# Patient Record
Sex: Female | Born: 1955 | Race: White | Hispanic: No | Marital: Married
Health system: Southern US, Community
[De-identification: ages and names within clinical notes are randomized; demographics above are authoritative.]

## PROBLEM LIST (undated history)

## (undated) HISTORY — PX: ABDOMINAL HYSTERECTOMY: SHX81

## (undated) HISTORY — PX: BREAST BIOPSY: SHX20

---

## 2004-01-14 ENCOUNTER — Ambulatory Visit: Payer: Self-pay | Admitting: Unknown Physician Specialty

## 2004-02-26 ENCOUNTER — Ambulatory Visit: Payer: Self-pay | Admitting: Unknown Physician Specialty

## 2012-06-21 ENCOUNTER — Encounter: Payer: Self-pay | Admitting: General Surgery

## 2012-07-04 ENCOUNTER — Encounter: Payer: Self-pay | Admitting: General Surgery

## 2012-07-06 ENCOUNTER — Encounter: Payer: Self-pay | Admitting: *Deleted

## 2015-06-04 ENCOUNTER — Other Ambulatory Visit: Payer: Self-pay | Admitting: Family Medicine

## 2015-06-04 DIAGNOSIS — Z1231 Encounter for screening mammogram for malignant neoplasm of breast: Secondary | ICD-10-CM

## 2015-06-05 ENCOUNTER — Ambulatory Visit
Admission: RE | Admit: 2015-06-05 | Discharge: 2015-06-05 | Disposition: A | Discharge status: Home / Self Care | Payer: BLUE CROSS/BLUE SHIELD | Source: Ambulatory Visit | Attending: Family Medicine | Admitting: Family Medicine

## 2015-06-05 DIAGNOSIS — Z1231 Encounter for screening mammogram for malignant neoplasm of breast: Secondary | ICD-10-CM | POA: Insufficient documentation

## 2015-10-15 ENCOUNTER — Ambulatory Visit
Admission: RE | Admit: 2015-10-15 | Payer: BLUE CROSS/BLUE SHIELD | Source: Ambulatory Visit | Admitting: Gastroenterology

## 2015-10-15 ENCOUNTER — Encounter: Admission: RE | Payer: Self-pay | Source: Ambulatory Visit

## 2015-10-15 SURGERY — COLONOSCOPY WITH PROPOFOL
Anesthesia: General

## 2017-10-26 ENCOUNTER — Other Ambulatory Visit: Payer: Self-pay | Admitting: Family Medicine

## 2017-10-26 DIAGNOSIS — Z1231 Encounter for screening mammogram for malignant neoplasm of breast: Secondary | ICD-10-CM

## 2017-10-27 ENCOUNTER — Ambulatory Visit
Admission: RE | Admit: 2017-10-27 | Discharge: 2017-10-27 | Disposition: A | Discharge status: Home / Self Care | Payer: Managed Care, Other (non HMO) | Source: Ambulatory Visit | Attending: Family Medicine | Admitting: Family Medicine

## 2017-10-27 DIAGNOSIS — Z1231 Encounter for screening mammogram for malignant neoplasm of breast: Secondary | ICD-10-CM | POA: Diagnosis present

## 2018-09-05 ENCOUNTER — Other Ambulatory Visit: Payer: Self-pay | Admitting: Family Medicine

## 2018-09-05 DIAGNOSIS — Z1231 Encounter for screening mammogram for malignant neoplasm of breast: Secondary | ICD-10-CM

## 2018-10-31 ENCOUNTER — Ambulatory Visit
Admission: RE | Admit: 2018-10-31 | Discharge: 2018-10-31 | Disposition: A | Discharge status: Home / Self Care | Payer: 59 | Source: Ambulatory Visit | Attending: Family Medicine | Admitting: Family Medicine

## 2018-10-31 DIAGNOSIS — Z1231 Encounter for screening mammogram for malignant neoplasm of breast: Secondary | ICD-10-CM

## 2019-09-11 ENCOUNTER — Other Ambulatory Visit: Payer: Self-pay | Admitting: Family Medicine

## 2019-09-11 DIAGNOSIS — Z1231 Encounter for screening mammogram for malignant neoplasm of breast: Secondary | ICD-10-CM

## 2019-11-02 ENCOUNTER — Ambulatory Visit
Admission: RE | Admit: 2019-11-02 | Discharge: 2019-11-02 | Disposition: A | Discharge status: Home / Self Care | Payer: 59 | Source: Ambulatory Visit | Attending: Family Medicine | Admitting: Family Medicine

## 2019-11-02 ENCOUNTER — Other Ambulatory Visit: Payer: Self-pay

## 2019-11-02 DIAGNOSIS — Z1231 Encounter for screening mammogram for malignant neoplasm of breast: Secondary | ICD-10-CM | POA: Diagnosis not present

## 2020-06-10 ENCOUNTER — Other Ambulatory Visit: Payer: Self-pay

## 2020-06-10 ENCOUNTER — Ambulatory Visit (INDEPENDENT_AMBULATORY_CARE_PROVIDER_SITE_OTHER): Payer: Managed Care, Other (non HMO) | Admitting: Dermatology

## 2020-06-10 DIAGNOSIS — I8393 Asymptomatic varicose veins of bilateral lower extremities: Secondary | ICD-10-CM | POA: Diagnosis not present

## 2020-06-10 DIAGNOSIS — Z872 Personal history of diseases of the skin and subcutaneous tissue: Secondary | ICD-10-CM | POA: Diagnosis not present

## 2020-06-10 DIAGNOSIS — L821 Other seborrheic keratosis: Secondary | ICD-10-CM

## 2020-06-10 DIAGNOSIS — D229 Melanocytic nevi, unspecified: Secondary | ICD-10-CM

## 2020-06-10 DIAGNOSIS — Z1283 Encounter for screening for malignant neoplasm of skin: Secondary | ICD-10-CM

## 2020-06-10 DIAGNOSIS — L814 Other melanin hyperpigmentation: Secondary | ICD-10-CM

## 2020-06-10 DIAGNOSIS — Z808 Family history of malignant neoplasm of other organs or systems: Secondary | ICD-10-CM | POA: Diagnosis not present

## 2020-06-10 DIAGNOSIS — L578 Other skin changes due to chronic exposure to nonionizing radiation: Secondary | ICD-10-CM

## 2020-06-10 DIAGNOSIS — D1800 Hemangioma unspecified site: Secondary | ICD-10-CM

## 2020-06-10 NOTE — Patient Instructions (Signed)

## 2020-06-10 NOTE — Progress Notes (Signed)
   Follow-Up Visit   Subjective  Brandy Hudson is a 65 y.o. female who presents for the following: Annual Exam (Family history of Melanoma - sister  TBSE today). The patient presents for Total-Body Skin Exam (TBSE) for skin cancer screening and mole check.  The following portions of the chart were reviewed this encounter and updated as appropriate:   Allergies  Meds  Problems  Med Hx  Surg Hx  Fam Hx     Review of Systems:  No other skin or systemic complaints except as noted in HPI or Assessment and Plan.  Objective  Well appearing patient in no apparent distress; mood and affect are within normal limits.  A full examination was performed including scalp, head, eyes, ears, nose, lips, neck, chest, axillae, abdomen, back, buttocks, bilateral upper extremities, bilateral lower extremities, hands, feet, fingers, toes, fingernails, and toenails. All findings within normal limits unless otherwise noted below.  Objective  Bilateral lower legs: Spider veins  Objective  Head - Anterior (Face): Clear today   Assessment & Plan    Lentigines - Scattered tan macules - Due to sun exposure - Benign-appering, observe - Recommend daily broad spectrum sunscreen SPF 30+ to sun-exposed areas, reapply every 2 hours as needed. - Call for any changes  Seborrheic Keratoses - Stuck-on, waxy, tan-brown papules and/or plaques  - Benign-appearing - Discussed benign etiology and prognosis. - Observe - Call for any changes  Melanocytic Nevi - Tan-brown and/or pink-flesh-colored symmetric macules and papules - Benign appearing on exam today - Observation - Call clinic for new or changing moles - Recommend daily use of broad spectrum spf 30+ sunscreen to sun-exposed areas.   Hemangiomas - Red papules - Discussed benign nature - Observe - Call for any changes  Actinic Damage - Chronic condition, secondary to cumulative UV/sun exposure - diffuse scaly erythematous macules with  underlying dyspigmentation - Recommend daily broad spectrum sunscreen SPF 30+ to sun-exposed areas, reapply every 2 hours as needed.  - Staying in the shade or wearing long sleeves, sun glasses (UVA+UVB protection) and wide brim hats (4-inch brim around the entire circumference of the hat) are also recommended for sun protection.  - Call for new or changing lesions.  Skin cancer screening performed today.  Family history of Melanoma - sister  Spider veins of both lower extremities Bilateral lower legs Benign, observe.   History of actinic keratoses Head - Anterior (Face) Clear. Observe for recurrence. Call clinic for new or changing lesions.  Recommend regular skin exams, daily broad-spectrum spf 30+ sunscreen use, and photoprotection.    Skin cancer screening  Return in about 1 year (around 06/10/2021).  I, Ashok Cordia, CMA, am acting as scribe for Sarina Ser, MD .  Documentation: I have reviewed the above documentation for accuracy and completeness, and I agree with the above.  Sarina Ser, MD

## 2020-06-11 ENCOUNTER — Encounter: Payer: Self-pay | Admitting: Dermatology

## 2020-09-24 ENCOUNTER — Other Ambulatory Visit: Payer: Self-pay | Admitting: Family Medicine

## 2020-09-24 DIAGNOSIS — Z1231 Encounter for screening mammogram for malignant neoplasm of breast: Secondary | ICD-10-CM

## 2020-11-04 ENCOUNTER — Ambulatory Visit
Admission: RE | Admit: 2020-11-04 | Discharge: 2020-11-04 | Disposition: A | Discharge status: Home / Self Care | Payer: Managed Care, Other (non HMO) | Source: Ambulatory Visit | Attending: Family Medicine | Admitting: Family Medicine

## 2020-11-04 ENCOUNTER — Other Ambulatory Visit: Payer: Self-pay

## 2020-11-04 DIAGNOSIS — Z1231 Encounter for screening mammogram for malignant neoplasm of breast: Secondary | ICD-10-CM | POA: Diagnosis not present

## 2021-06-16 ENCOUNTER — Ambulatory Visit (INDEPENDENT_AMBULATORY_CARE_PROVIDER_SITE_OTHER): Payer: Managed Care, Other (non HMO) | Admitting: Dermatology

## 2021-06-16 DIAGNOSIS — L820 Inflamed seborrheic keratosis: Secondary | ICD-10-CM

## 2021-06-16 DIAGNOSIS — Z1283 Encounter for screening for malignant neoplasm of skin: Secondary | ICD-10-CM | POA: Diagnosis not present

## 2021-06-16 DIAGNOSIS — D225 Melanocytic nevi of trunk: Secondary | ICD-10-CM | POA: Diagnosis not present

## 2021-06-16 DIAGNOSIS — L578 Other skin changes due to chronic exposure to nonionizing radiation: Secondary | ICD-10-CM | POA: Diagnosis not present

## 2021-06-16 DIAGNOSIS — D485 Neoplasm of uncertain behavior of skin: Secondary | ICD-10-CM

## 2021-06-16 DIAGNOSIS — L570 Actinic keratosis: Secondary | ICD-10-CM | POA: Diagnosis not present

## 2021-06-16 DIAGNOSIS — D229 Melanocytic nevi, unspecified: Secondary | ICD-10-CM | POA: Diagnosis not present

## 2021-06-16 DIAGNOSIS — L821 Other seborrheic keratosis: Secondary | ICD-10-CM

## 2021-06-16 DIAGNOSIS — L814 Other melanin hyperpigmentation: Secondary | ICD-10-CM

## 2021-06-16 DIAGNOSIS — D1800 Hemangioma unspecified site: Secondary | ICD-10-CM | POA: Diagnosis not present

## 2021-06-16 NOTE — Patient Instructions (Signed)
Wound Care Instructions ? ?Cleanse wound gently with soap and water once a day then pat dry with clean gauze. Apply a thing coat of Petrolatum (petroleum jelly, "Vaseline") over the wound (unless you have an allergy to this). We recommend that you use a new, sterile tube of Vaseline. Do not pick or remove scabs. Do not remove the yellow or white "healing tissue" from the base of the wound. ? ?Cover the wound with fresh, clean, nonstick gauze and secure with paper tape. You may use Band-Aids in place of gauze and tape if the would is small enough, but would recommend trimming much of the tape off as there is often too much. Sometimes Band-Aids can irritate the skin. ? ?You should call the office for your biopsy report after 1 week if you have not already been contacted. ? ?If you experience any problems, such as abnormal amounts of bleeding, swelling, significant bruising, significant pain, or evidence of infection, please call the office immediately. ? ?FOR ADULT SURGERY PATIENTS: If you need something for pain relief you may take 1 extra strength Tylenol (acetaminophen) AND 2 Ibuprofen ('200mg'$  each) together every 4 hours as needed for pain. (do not take these if you are allergic to them or if you have a reason you should not take them.) Typically, you may only need pain medication for 1 to 3 days.  ? ? ? ?Cryotherapy Aftercare ? ?Wash gently with soap and water everyday.   ?Apply Vaseline and Band-Aid daily until healed.  ? ? ?If You Need Anything After Your Visit ? ?If you have any questions or concerns for your doctor, please call our main line at [phone] and press option 4 to reach your doctor's medical assistant. If no one answers, please leave a voicemail as directed and we will return your call as soon as possible. Messages left after 4 pm will be answered the following business day.  ? ?You may also send [place] a message via MyChart. We typically respond to MyChart messages within 1-2 business days. ? ?For  prescription refills, please ask your pharmacy to contact our office. Our fax number is [phone]. ? ?If you have an urgent issue when the clinic is closed that cannot wait until the next business day, you can page your doctor at the number below.   ? ?Please note that while we do our best to be available for urgent issues outside of office hours, we are not available 24/7.  ? ?If you have an urgent issue and are unable to reach [place], you may choose to seek medical care at your doctor's office, retail clinic, urgent care center, or emergency room. ? ?If you have a medical emergency, please immediately call 911 or go to the emergency department. ? ?Pager Numbers ? ?- Dr. Nehemiah Massed: [phone] ? ?- Dr. Laurence Ferrari: [phone] ? ?- Dr. Nicole Kindred: [phone] ? ?In the event of inclement weather, please call our main line at [phone] for an update on the status of any delays or closures. ? ?Dermatology Medication Tips: ?Please keep the boxes that topical medications come in in order to help keep track of the instructions about where and how to use these. Pharmacies typically print the medication instructions only on the boxes and not directly on the medication tubes.  ? ?If your medication is too expensive, please contact our office at [phone] option 4 or send [place] a message through [place].  ? ?We are unable to tell what your co-pay for medications will be in advance as  this is different depending on your insurance coverage. However, we may be able to find a substitute medication at lower cost or fill out paperwork to get insurance to cover a needed medication.  ? ?If a prior authorization is required to get your medication covered by your insurance company, please allow [place] 1-2 business days to complete this process. ? ?Drug prices often vary depending on where the prescription is filled and some pharmacies may offer cheaper prices. ? ?The website www.goodrx.com contains coupons for medications through different  pharmacies. The prices here do not account for what the cost may be with help from insurance (it may be cheaper with your insurance), but the website can give you the price if you did not use any insurance.  ?- You can print the associated coupon and take it with your prescription to the pharmacy.  ?- You may also stop by our office during regular business hours and pick up a GoodRx coupon card.  ?- If you need your prescription sent electronically to a different pharmacy, notify our office through [facility] or by phone at [phone] option 4. ? ? ? ? ?Si Usted Necesita Algo Despu?s de Su Visita ? ?Tambi?n puede enviarnos un mensaje a trav?s de MyChart. Por lo general respondemos a los mensajes de MyChart en el transcurso de 1 a 2 d?as h?biles. ? ?Para renovar recetas, por favor pida a su farmacia que se ponga en contacto con nuestra oficina. Nuestro n?mero de fax es el [phone]. ? ?Si tiene un asunto urgente cuando la cl?nica est? cerrada y que no puede esperar hasta el siguiente d?a h?bil, puede llamar/localizar a su doctor(a) al n?mero que aparece a continuaci?n.  ? ?Por favor, tenga en cuenta que aunque hacemos todo lo posible para estar disponibles para asuntos urgentes fuera del horario de oficina, no estamos disponibles las 24 horas del d?a, los 7 d?as de la semana.  ? ?Si tiene un problema urgente y no puede comunicarse con nosotros, puede optar por buscar atenci?n m?dica  en el consultorio de su doctor(a), en una cl?nica privada, en un centro de atenci?n urgente o en una sala de emergencias. ? ?Si tiene Engineer, maintenance (IT) m?dica, por favor llame inmediatamente al 911 o vaya a la sala de emergencias. ? ?N?meros de b?per ? ?- Dr. Nehemiah Massed: [phone] ? ?- Dra. Moye: [phone] ? ?- Dra. Nicole Kindred: [phone] ? ?En caso de inclemencias del tiempo, por favor llame a nuestra l?nea principal al [phone] para una actualizaci?n sobre el estado de cualquier retraso o cierre. ? ?Consejos para la  medicaci?n en dermatolog?a: ?Por favor, guarde las cajas en las que vienen los medicamentos de uso t?pico para ayudarle a seguir las instrucciones sobre d?nde y c?mo usarlos. Las farmacias generalmente imprimen las instrucciones del medicamento s?lo en las cajas y no directamente en los tubos del [place].  ? ?Si su medicamento es muy caro, por favor, p?ngase en contacto con Zigmund Daniel llamando al [phone] y presione la opci?n 4 o env?enos un mensaje a trav?s de MyChart.  ? ?No podemos decirle cu?l ser? su copago por los medicamentos por adelantado ya que esto es diferente dependiendo de la cobertura de su seguro. Sin embargo, es posible que podamos encontrar un medicamento sustituto a Electrical engineer un formulario para que el seguro cubra el medicamento que se considera necesario.  ? ?Si se requiere Ardelia Mems autorizaci?n previa para que su compa??a de seguros [place] su medicamento, por favor perm?tanos de 1 a 2 d?as h?biles  para Education officer, community. ? ?Los precios de los medicamentos var?an con frecuencia dependiendo del Environmental consultant de d?nde se surte la receta y alguna farmacias pueden ofrecer precios m?s baratos. ? ?El sitio web www.goodrx.com tiene cupones para medicamentos de Airline pilot. Los precios aqu? no tienen en cuenta lo que podr?a costar con la ayuda del seguro (puede ser m?s barato con su seguro), pero el sitio web puede darle el precio si no utiliz? ning?n seguro.  ?- Puede imprimir el cup?n correspondiente y llevarlo con su receta a la farmacia.  ?- Tambi?n puede pasar por nuestra oficina durante el horario de atenci?n regular y recoger una tarjeta de cupones de GoodRx.  ?- Si necesita que su receta se env?e electr?nicamente a Chiropodist, informe a nuestra oficina a trav?s de MyChart de [place] o por tel?fono llamando al [phone] y presione la opci?n 4.  ?

## 2021-06-16 NOTE — Progress Notes (Signed)
Follow-Up Visit   Subjective  Brandy Hudson is a 66 y.o. female who presents for the following: Annual Exam (No history of skin cancer or abnormal moles - The patient presents for Total-Body Skin Exam (TBSE) for skin cancer screening and mole check.  The patient has spots, moles and lesions to be evaluated, some may be new or changing and the patient has concerns that these could be cancer./) and Other (Spots of right arm and right post ankle).  The following portions of the chart were reviewed this encounter and updated as appropriate:   Allergies  Meds  Problems  Med Hx  Surg Hx  Fam Hx      Review of Systems:  No other skin or systemic complaints except as noted in HPI or Assessment and Plan.  Objective  Well appearing patient in no apparent distress; mood and affect are within normal limits.  A full examination was performed including scalp, head, eyes, ears, nose, lips, neck, chest, axillae, abdomen, back, buttocks, bilateral upper extremities, bilateral lower extremities, hands, feet, fingers, toes, fingernails, and toenails. All findings within normal limits unless otherwise noted below.  Right Forearm Erythematous stuck-on, waxy papule or plaque  Left cheek Erythematous thin papules/macules with gritty scale.   Right post waistline 0.6 cm irregular brown macule   Assessment & Plan   Lentigines - Scattered tan macules - Due to sun exposure - Benign-appearing, observe - Recommend daily broad spectrum sunscreen SPF 30+ to sun-exposed areas, reapply every 2 hours as needed. - Call for any changes  Seborrheic Keratoses - Stuck-on, waxy, tan-brown papules and/or plaques  - Benign-appearing - Discussed benign etiology and prognosis. - Observe - Call for any changes  Melanocytic Nevi - Tan-brown and/or pink-flesh-colored symmetric macules and papules - Benign appearing on exam today - Observation - Call clinic for new or changing moles - Recommend daily use of  broad spectrum spf 30+ sunscreen to sun-exposed areas.   Hemangiomas - Red papules - Discussed benign nature - Observe - Call for any changes  Actinic Damage - Chronic condition, secondary to cumulative UV/sun exposure - diffuse scaly erythematous macules with underlying dyspigmentation - Recommend daily broad spectrum sunscreen SPF 30+ to sun-exposed areas, reapply every 2 hours as needed.  - Staying in the shade or wearing long sleeves, sun glasses (UVA+UVB protection) and wide brim hats (4-inch brim around the entire circumference of the hat) are also recommended for sun protection.  - Call for new or changing lesions.  Skin cancer screening performed today.  Inflamed seborrheic keratosis Right Forearm  May use 1% hydrocortisone cream daily to decrease itch  Destruction of lesion - Right Forearm Complexity: simple   Destruction method: cryotherapy   Informed consent: discussed and consent obtained   Timeout:  patient name, date of birth, surgical site, and procedure verified Lesion destroyed using liquid nitrogen: Yes   Region frozen until ice ball extended beyond lesion: Yes   Outcome: patient tolerated procedure well with no complications   Post-procedure details: wound care instructions given    AK (actinic keratosis) Left cheek  Destruction of lesion - Left cheek Complexity: simple   Destruction method: cryotherapy   Informed consent: discussed and consent obtained   Timeout:  patient name, date of birth, surgical site, and procedure verified Lesion destroyed using liquid nitrogen: Yes   Region frozen until ice ball extended beyond lesion: Yes   Outcome: patient tolerated procedure well with no complications   Post-procedure details: wound care instructions given  Neoplasm of uncertain behavior of skin Right post waistline  Epidermal / dermal shaving  Lesion diameter (cm):  0.6 Informed consent: discussed and consent obtained   Timeout: patient name, date  of birth, surgical site, and procedure verified   Procedure prep:  Patient was prepped and draped in usual sterile fashion Prep type:  Isopropyl alcohol Anesthesia: the lesion was anesthetized in a standard fashion   Anesthetic:  1% lidocaine w/ epinephrine 1-100,000 buffered w/ 8.4% NaHCO3 Instrument used: flexible razor blade   Hemostasis achieved with: pressure, aluminum chloride and electrodesiccation   Outcome: patient tolerated procedure well   Post-procedure details: sterile dressing applied and wound care instructions given   Dressing type: bandage and petrolatum    Related Procedures Anatomic Pathology Report  Skin cancer screening   Return in about 1 year (around 06/17/2022) for TBSE.  I, Ashok Cordia, CMA, am acting as scribe for Sarina Ser, MD . Documentation: I have reviewed the above documentation for accuracy and completeness, and I agree with the above.  Sarina Ser, MD

## 2021-06-20 LAB — ANATOMIC PATHOLOGY REPORT

## 2021-06-23 ENCOUNTER — Telehealth: Payer: Self-pay

## 2021-06-23 NOTE — Telephone Encounter (Signed)
-----   Message from Ralene Bathe, MD sent at 06/20/2021  6:40 PM EDT ----- Diagnosis synopsis: Comment  Comment: Specimen A-Skin Excision, right posterior waistline:  JUNCTIONAL NEVUS  Benign mole No further treatment needed

## 2021-06-23 NOTE — Telephone Encounter (Signed)
Left voicemail to return my call

## 2021-06-27 ENCOUNTER — Encounter: Payer: Self-pay | Admitting: Dermatology

## 2021-07-09 ENCOUNTER — Telehealth: Payer: Self-pay

## 2021-07-09 NOTE — Telephone Encounter (Signed)
Advised patient of results/hd  

## 2021-07-09 NOTE — Telephone Encounter (Signed)
-----   Message from Ralene Bathe, MD sent at 06/20/2021  6:40 PM EDT ----- Diagnosis synopsis: Comment  Comment: Specimen A-Skin Excision, right posterior waistline:  JUNCTIONAL NEVUS  Benign mole No further treatment needed

## 2021-10-09 ENCOUNTER — Other Ambulatory Visit: Payer: Self-pay | Admitting: Family Medicine

## 2021-10-09 DIAGNOSIS — Z1231 Encounter for screening mammogram for malignant neoplasm of breast: Secondary | ICD-10-CM

## 2021-11-05 ENCOUNTER — Ambulatory Visit
Admission: RE | Admit: 2021-11-05 | Discharge: 2021-11-05 | Disposition: A | Discharge status: Home / Self Care | Payer: 59 | Source: Ambulatory Visit | Attending: Family Medicine | Admitting: Family Medicine

## 2021-11-05 DIAGNOSIS — Z1231 Encounter for screening mammogram for malignant neoplasm of breast: Secondary | ICD-10-CM | POA: Diagnosis not present

## 2022-02-19 IMAGING — MG MM DIGITAL SCREENING BILAT W/ TOMO AND CAD
8 series · 9 of 24 positions shown · non-contrast
Comparison: Previous exam(s).

CLINICAL DATA: Screening.

EXAM:
DIGITAL SCREENING BILATERAL MAMMOGRAM WITH TOMOSYNTHESIS AND CAD
TECHNIQUE: Bilateral screening digital craniocaudal and mediolateral oblique
mammograms were obtained. Bilateral screening digital breast
tomosynthesis was performed. The images were evaluated with
computer-aided detection.

[R MLO synth-2D]
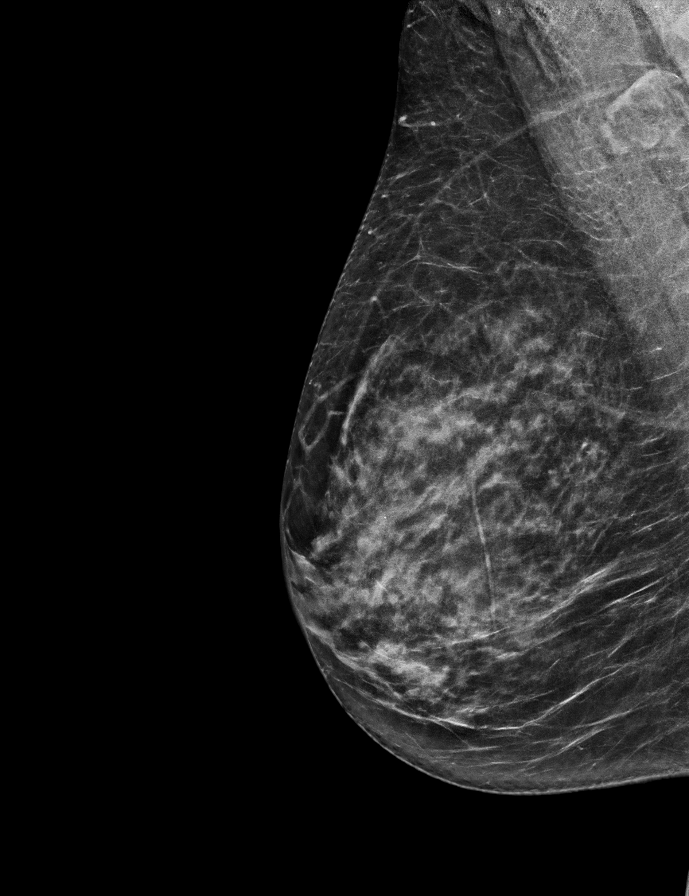

[R CC synth-2D]
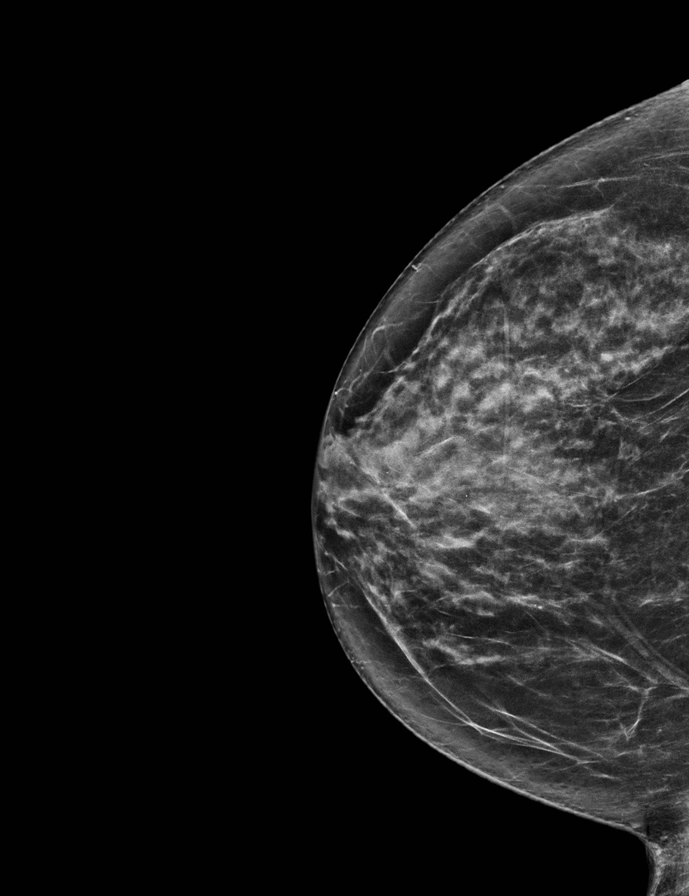

[L CC synth-2D]
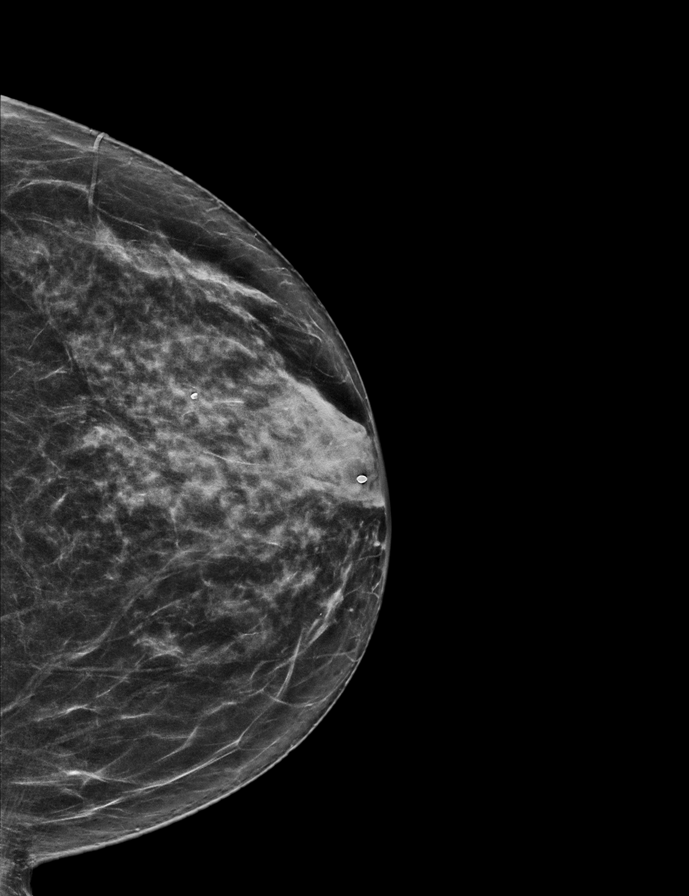

[L MLO synth-2D]
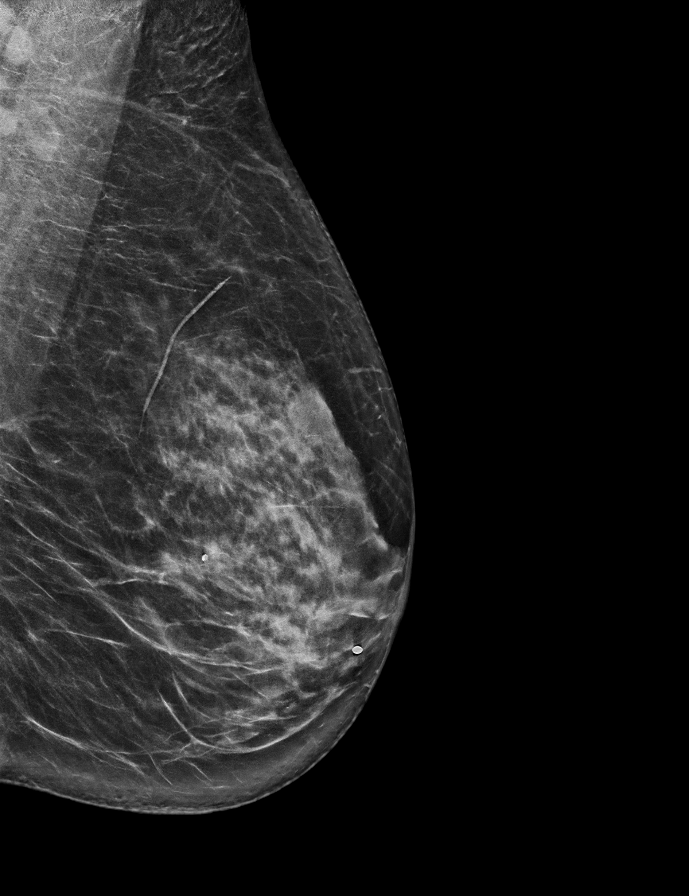

[L CC tomo · 2 of 59 frames shown]
[frame 20/59]
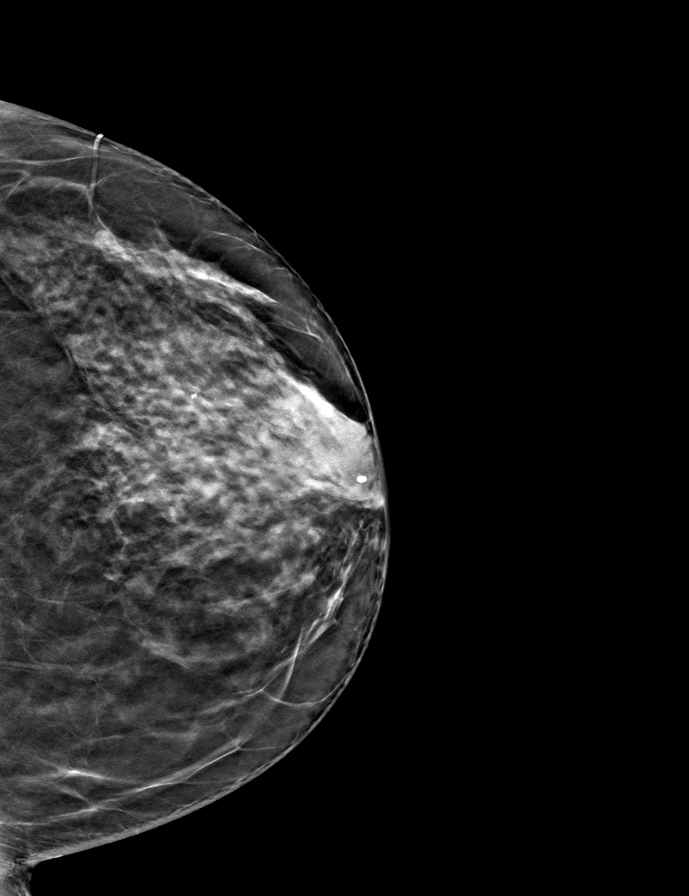
[frame 30/59]
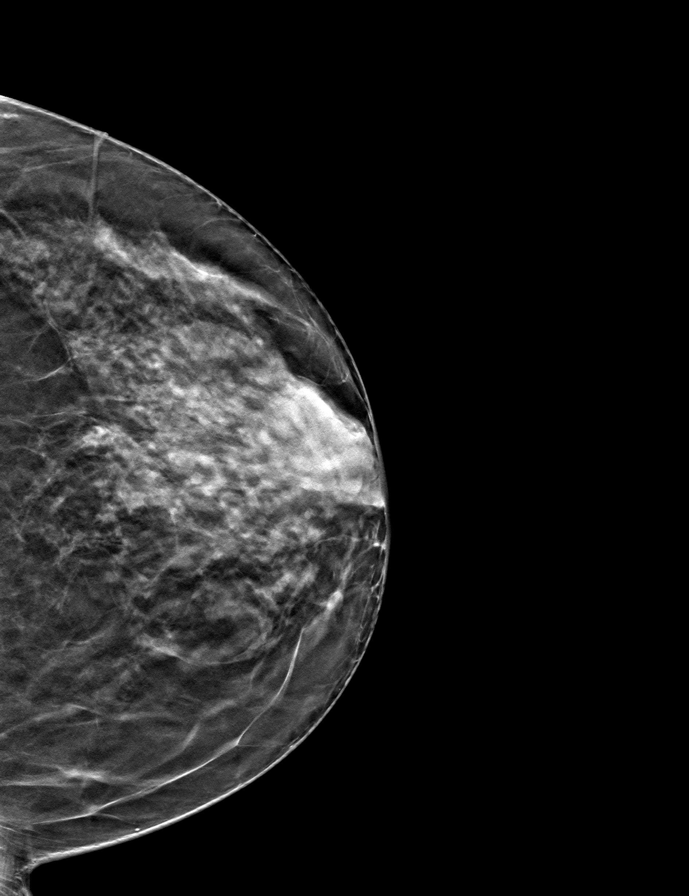

[L MLO tomo · tomo slice 37/72.0]
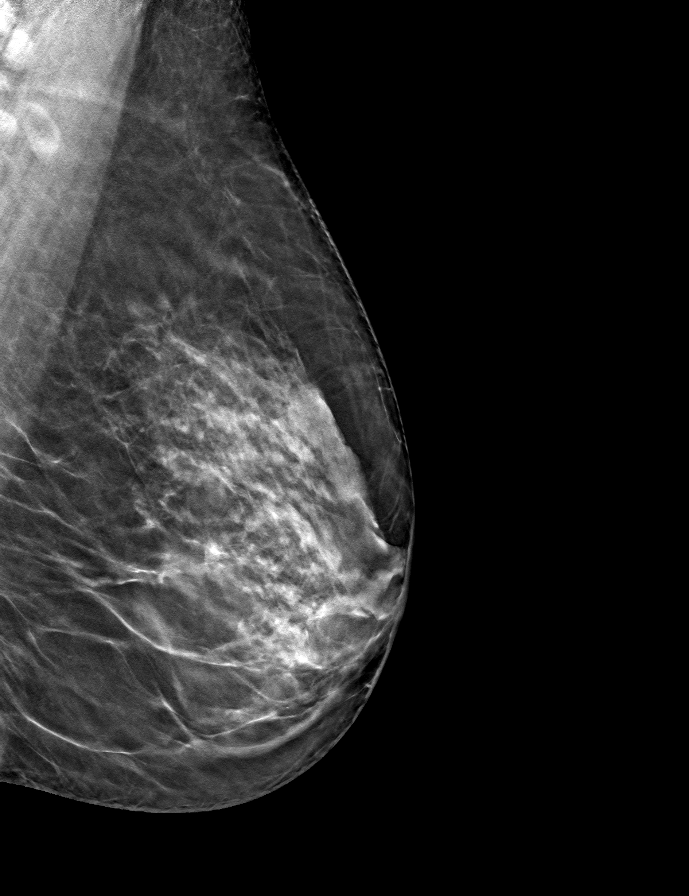

[R MLO tomo · tomo slice 32/63.0]
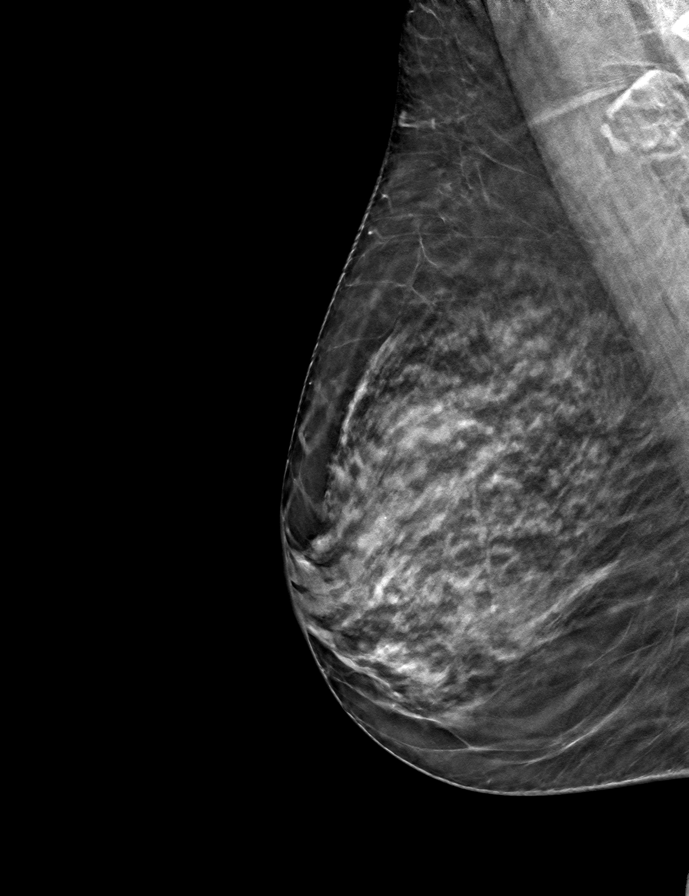

[R CC tomo · tomo slice 33/66.0]
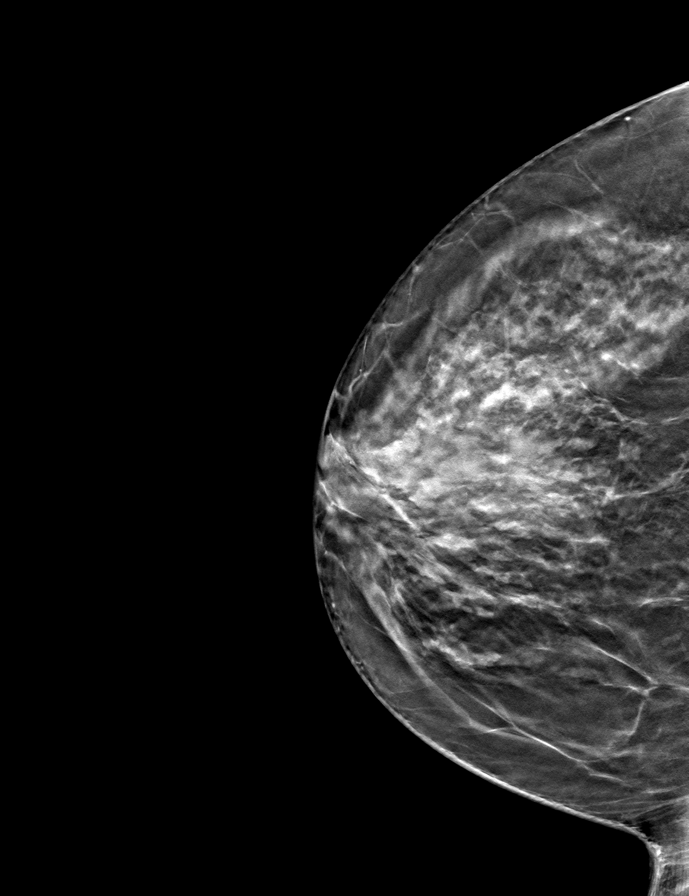

[9 of 24 positions shown; findings below may reference images not displayed]

ACR Breast Density Category c: The breast tissue is heterogeneously
dense, which may obscure small masses.
FINDINGS: There are no findings suspicious for malignancy.
IMPRESSION: No mammographic evidence of malignancy. A result letter of this
screening mammogram will be mailed directly to the patient.

RECOMMENDATION:
Screening mammogram in one year. (Code:Q3-W-BC3)

BI-RADS CATEGORY  1: Negative.

## 2022-06-18 ENCOUNTER — Ambulatory Visit: Payer: Managed Care, Other (non HMO) | Admitting: Dermatology

## 2022-06-18 ENCOUNTER — Ambulatory Visit: Payer: Medicare Other | Admitting: Dermatology

## 2022-06-18 VITALS — BP 135/71 | HR 69

## 2022-06-18 DIAGNOSIS — D1801 Hemangioma of skin and subcutaneous tissue: Secondary | ICD-10-CM

## 2022-06-18 DIAGNOSIS — D485 Neoplasm of uncertain behavior of skin: Secondary | ICD-10-CM

## 2022-06-18 DIAGNOSIS — L578 Other skin changes due to chronic exposure to nonionizing radiation: Secondary | ICD-10-CM

## 2022-06-18 DIAGNOSIS — D2271 Melanocytic nevi of right lower limb, including hip: Secondary | ICD-10-CM | POA: Diagnosis not present

## 2022-06-18 DIAGNOSIS — D692 Other nonthrombocytopenic purpura: Secondary | ICD-10-CM

## 2022-06-18 DIAGNOSIS — L570 Actinic keratosis: Secondary | ICD-10-CM

## 2022-06-18 DIAGNOSIS — L814 Other melanin hyperpigmentation: Secondary | ICD-10-CM | POA: Diagnosis not present

## 2022-06-18 DIAGNOSIS — Z808 Family history of malignant neoplasm of other organs or systems: Secondary | ICD-10-CM

## 2022-06-18 DIAGNOSIS — L820 Inflamed seborrheic keratosis: Secondary | ICD-10-CM

## 2022-06-18 DIAGNOSIS — D229 Melanocytic nevi, unspecified: Secondary | ICD-10-CM

## 2022-06-18 DIAGNOSIS — Z1283 Encounter for screening for malignant neoplasm of skin: Secondary | ICD-10-CM | POA: Diagnosis not present

## 2022-06-18 DIAGNOSIS — I8393 Asymptomatic varicose veins of bilateral lower extremities: Secondary | ICD-10-CM

## 2022-06-18 DIAGNOSIS — X32XXXA Exposure to sunlight, initial encounter: Secondary | ICD-10-CM

## 2022-06-18 DIAGNOSIS — W908XXA Exposure to other nonionizing radiation, initial encounter: Secondary | ICD-10-CM

## 2022-06-18 DIAGNOSIS — L821 Other seborrheic keratosis: Secondary | ICD-10-CM

## 2022-06-18 NOTE — Patient Instructions (Addendum)
Wound Care Instructions  Cleanse wound gently with soap and water once a day then pat dry with clean gauze. Apply a thin coat of Petrolatum (petroleum jelly, "Vaseline") over the wound (unless you have an allergy to this). We recommend that you use a new, sterile tube of Vaseline. Do not pick or remove scabs. Do not remove the yellow or white "healing tissue" from the base of the wound.  Cover the wound with fresh, clean, nonstick gauze and secure with paper tape. You may use Band-Aids in place of gauze and tape if the wound is small enough, but would recommend trimming much of the tape off as there is often too much. Sometimes Band-Aids can irritate the skin.  You should call the office for your biopsy report after 1 week if you have not already been contacted.  If you experience any problems, such as abnormal amounts of bleeding, swelling, significant bruising, significant pain, or evidence of infection, please call the office immediately.  FOR ADULT SURGERY PATIENTS: If you need something for pain relief you may take 1 extra strength Tylenol (acetaminophen) AND 2 Ibuprofen (200mg each) together every 4 hours as needed for pain. (do not take these if you are allergic to them or if you have a reason you should not take them.) Typically, you may only need pain medication for 1 to 3 days.     Due to recent changes in healthcare laws, you may see results of your pathology and/or laboratory studies on MyChart before the doctors have had a chance to review them. We understand that in some cases there may be results that are confusing or concerning to you. Please understand that not all results are received at the same time and often the doctors may need to interpret multiple results in order to provide you with the best plan of care or course of treatment. Therefore, we ask that you please give us 2 business days to thoroughly review all your results before contacting the office for clarification. Should  we see a critical lab result, you will be contacted sooner.   If You Need Anything After Your Visit  If you have any questions or concerns for your doctor, please call our main line at [phone] and press option 4 to reach your doctor's medical assistant. If no one answers, please leave a voicemail as directed and we will return your call as soon as possible. Messages left after 4 pm will be answered the following business day.   You may also send us a message via MyChart. We typically respond to MyChart messages within 1-2 business days.  For prescription refills, please ask your pharmacy to contact our office. Our fax number is [phone].  If you have an urgent issue when the clinic is closed that cannot wait until the next business day, you can page your doctor at the number below.    Please note that while we do our best to be available for urgent issues outside of office hours, we are not available 24/7.   If you have an urgent issue and are unable to reach us, you may choose to seek medical care at your doctor's office, retail clinic, urgent care center, or emergency room.  If you have a medical emergency, please immediately call 911 or go to the emergency department.  Pager Numbers  - Dr. Kowalski: [phone]  - Dr. Moye: [phone]  - Dr. Stewart: [phone]  In the event of inclement weather, please call our main line at   [phone] for an update on the status of any delays or closures.  Dermatology Medication Tips: Please keep the boxes that topical medications come in in order to help keep track of the instructions about where and how to use these. Pharmacies typically print the medication instructions only on the boxes and not directly on the medication tubes.   If your medication is too expensive, please contact our office at [phone] option 4 or send us a message through MyChart.   We are unable to tell what your co-pay for medications will be in  advance as this is different depending on your insurance coverage. However, we may be able to find a substitute medication at lower cost or fill out paperwork to get insurance to cover a needed medication.   If a prior authorization is required to get your medication covered by your insurance company, please allow us 1-2 business days to complete this process.  Drug prices often vary depending on where the prescription is filled and some pharmacies may offer cheaper prices.  The website www.goodrx.com contains coupons for medications through different pharmacies. The prices here do not account for what the cost may be with help from insurance (it may be cheaper with your insurance), but the website can give you the price if you did not use any insurance.  - You can print the associated coupon and take it with your prescription to the pharmacy.  - You may also stop by our office during regular business hours and pick up a GoodRx coupon card.  - If you need your prescription sent electronically to a different pharmacy, notify our office through [facility] MyChart or by phone at [phone] option 4.     Si Usted Necesita Algo Despus de Su Visita  Tambin puede enviarnos un mensaje a travs de MyChart. Por lo general respondemos a los mensajes de MyChart en el transcurso de 1 a 2 das hbiles.  Para renovar recetas, por favor pida a su farmacia que se ponga en contacto con nuestra oficina. Nuestro nmero de fax es el [phone].  Si tiene un asunto urgente cuando la clnica est cerrada y que no puede esperar hasta el siguiente da hbil, puede llamar/localizar a su doctor(a) al nmero que aparece a continuacin.   Por favor, tenga en cuenta que aunque hacemos todo lo posible para estar disponibles para asuntos urgentes fuera del horario de oficina, no estamos disponibles las 24 horas del da, los 7 das de la semana.   Si tiene un problema urgente y no puede comunicarse con nosotros, puede  optar por buscar atencin mdica  en el consultorio de su doctor(a), en una clnica privada, en un centro de atencin urgente o en una sala de emergencias.  Si tiene una emergencia mdica, por favor llame inmediatamente al 911 o vaya a la sala de emergencias.  Nmeros de bper  - Dr. Kowalski: [phone]  - Dra. Moye: [phone]  - Dra. Stewart: [phone]  En caso de inclemencias del tiempo, por favor llame a nuestra lnea principal al [phone] para una actualizacin sobre el estado de cualquier retraso o cierre.  Consejos para la medicacin en dermatologa: Por favor, guarde las cajas en las que vienen los medicamentos de uso tpico para ayudarle a seguir las instrucciones sobre dnde y cmo usarlos. Las farmacias generalmente imprimen las instrucciones del medicamento slo en las cajas y no directamente en los tubos del medicamento.   Si su medicamento es muy caro, por favor, pngase en contacto con   nuestra oficina llamando al [phone] y presione la opcin 4 o envenos un mensaje a travs de MyChart.   No podemos decirle cul ser su copago por los medicamentos por adelantado ya que esto es diferente dependiendo de la cobertura de su seguro. Sin embargo, es posible que podamos encontrar un medicamento sustituto a menor costo o llenar un formulario para que el seguro cubra el medicamento que se considera necesario.   Si se requiere una autorizacin previa para que su compaa de seguros cubra su medicamento, por favor permtanos de 1 a 2 das hbiles para completar este proceso.  Los precios de los medicamentos varan con frecuencia dependiendo del lugar de dnde se surte la receta y alguna farmacias pueden ofrecer precios ms baratos.  El sitio web www.goodrx.com tiene cupones para medicamentos de diferentes farmacias. Los precios aqu no tienen en cuenta lo que podra costar con la ayuda del seguro (puede ser ms barato con su seguro), pero el sitio web puede darle el  precio si no utiliz ningn seguro.  - Puede imprimir el cupn correspondiente y llevarlo con su receta a la farmacia.  - Tambin puede pasar por nuestra oficina durante el horario de atencin regular y recoger una tarjeta de cupones de GoodRx.  - Si necesita que su receta se enve electrnicamente a una farmacia diferente, informe a nuestra oficina a travs de MyChart de [place] o por telfono llamando al [phone] y presione la opcin 4.  

## 2022-06-18 NOTE — Progress Notes (Signed)
Follow-Up Visit   Subjective  Brandy Hudson is a 67 y.o. female who presents for the following: Skin Cancer Screening and Full Body Skin Exam  The patient presents for Total-Body Skin Exam (TBSE) for skin cancer screening and mole check. The patient has spots, moles and lesions to be evaluated, some may be new or changing and the patient has concerns that these could be cancer.    The following portions of the chart were reviewed this encounter and updated as appropriate: medications, allergies, medical history  Review of Systems:  No other skin or systemic complaints except as noted in HPI or Assessment and Plan.  Objective  Well appearing patient in no apparent distress; mood and affect are within normal limits.  A full examination was performed including scalp, head, eyes, ears, nose, lips, neck, chest, axillae, abdomen, back, buttocks, bilateral upper extremities, bilateral lower extremities, hands, feet, fingers, toes, fingernails, and toenails. All findings within normal limits unless otherwise noted below.   Relevant physical exam findings are noted in the Assessment and Plan.  R med thigh 0.8 cm dark irregular brown macule.  Chest x 3 Erythematous stuck-on, waxy papule or plaque  L glabella x 1 Erythematous thin papules/macules with gritty scale.     Assessment & Plan   LENTIGINES, SEBORRHEIC KERATOSES, HEMANGIOMAS - Benign normal skin lesions - Benign-appearing - Call for any changes  MELANOCYTIC NEVI - Tan-brown and/or pink-flesh-colored symmetric macules and papules - Benign appearing on exam today - Observation - Call clinic for new or changing moles - Recommend daily use of broad spectrum spf 30+ sunscreen to sun-exposed areas.   ACTINIC DAMAGE - Chronic condition, secondary to cumulative UV/sun exposure - diffuse scaly erythematous macules with underlying dyspigmentation - Recommend daily broad spectrum sunscreen SPF 30+ to sun-exposed areas, reapply  every 2 hours as needed.  - Staying in the shade or wearing long sleeves, sun glasses (UVA+UVB protection) and wide brim hats (4-inch brim around the entire circumference of the hat) are also recommended for sun protection.  - Call for new or changing lesions.  FAMILY HISTORY OF SKIN CANCER What type(s): Melanoma  Who affected: Sister  Purpura - Chronic; persistent and recurrent.  Treatable, but not curable. - Violaceous macules and patches - Benign - Related to trauma, age, sun damage and/or use of blood thinners, chronic use of topical and/or oral steroids - Observe - Can use OTC arnica containing moisturizer such as Dermend Bruise Formula if desired - Call for worsening or other concerns  Varicose Veins/Spider Veins - Dilated blue, purple or red veins at the lower extremities - Reassured - Smaller vessels can be treated by sclerotherapy (a procedure to inject a medicine into the veins to make them disappear) if desired, but the treatment is not covered by insurance. Larger vessels may be covered if symptomatic and we would refer to vascular surgeon if treatment desired.  Neoplasm of uncertain behavior of skin R med thigh  Epidermal / dermal shaving  Lesion diameter (cm):  0.8 Informed consent: discussed and consent obtained   Timeout: patient name, date of birth, surgical site, and procedure verified   Procedure prep:  Patient was prepped and draped in usual sterile fashion Prep type:  Isopropyl alcohol Anesthesia: the lesion was anesthetized in a standard fashion   Anesthetic:  1% lidocaine w/ epinephrine 1-100,000 buffered w/ 8.4% NaHCO3 Instrument used: flexible razor blade   Hemostasis achieved with: pressure, aluminum chloride and electrodesiccation   Outcome: patient tolerated procedure well  Post-procedure details: sterile dressing applied and wound care instructions given   Dressing type: bandage and petrolatum    R/o dysplasia vs thrombosed hemangioma   Related  Procedures Pathology ([facility])  Inflamed seborrheic keratosis Chest x 3  Destruction of lesion - Chest x 3 Complexity: simple   Destruction method: cryotherapy   Informed consent: discussed and consent obtained   Timeout:  patient name, date of birth, surgical site, and procedure verified Lesion destroyed using liquid nitrogen: Yes   Region frozen until ice ball extended beyond lesion: Yes   Outcome: patient tolerated procedure well with no complications   Post-procedure details: wound care instructions given    AK (actinic keratosis) L glabella x 1  Destruction of lesion - L glabella x 1 Complexity: simple   Destruction method: cryotherapy   Informed consent: discussed and consent obtained   Timeout:  patient name, date of birth, surgical site, and procedure verified Lesion destroyed using liquid nitrogen: Yes   Region frozen until ice ball extended beyond lesion: Yes   Outcome: patient tolerated procedure well with no complications   Post-procedure details: wound care instructions given    SKIN CANCER SCREENING PERFORMED TODAY.  Return in about 1 year (around 06/18/2023) for TBSE.  Maylene Roes, CMA, am acting as scribe for Armida Sans, MD .  Documentation: I have reviewed the above documentation for accuracy and completeness, and I agree with the above.  Armida Sans, MD

## 2022-06-27 LAB — ANATOMIC PATHOLOGY REPORT

## 2022-07-01 ENCOUNTER — Encounter: Payer: Self-pay | Admitting: Dermatology

## 2022-07-01 ENCOUNTER — Telehealth: Payer: Self-pay

## 2022-07-01 NOTE — Telephone Encounter (Signed)
-----   Message from Deirdre Evener, MD sent at 07/01/2022  3:30 PM EDT ----- Diagnosis synopsis: Comment Comment: Part A-right med thigh ,Skin Biopsy: LENTIGINOUS JUNCTIONAL MELANOCYTIC NEVUS, IRRITATED AND INFLAMED.  Benign irritated mole No further treatment needed

## 2022-07-01 NOTE — Telephone Encounter (Signed)
Advised patient of results/hd  

## 2022-10-28 ENCOUNTER — Other Ambulatory Visit: Payer: Self-pay | Admitting: Family Medicine

## 2022-10-28 DIAGNOSIS — Z1231 Encounter for screening mammogram for malignant neoplasm of breast: Secondary | ICD-10-CM

## 2022-11-13 ENCOUNTER — Ambulatory Visit
Admission: RE | Admit: 2022-11-13 | Discharge: 2022-11-13 | Disposition: A | Discharge status: Home / Self Care | Payer: Medicare Other | Source: Ambulatory Visit | Attending: Family Medicine | Admitting: Family Medicine

## 2022-11-13 DIAGNOSIS — Z1231 Encounter for screening mammogram for malignant neoplasm of breast: Secondary | ICD-10-CM | POA: Insufficient documentation

## 2023-06-23 ENCOUNTER — Ambulatory Visit: Payer: Medicare Other | Admitting: Dermatology

## 2023-08-03 ENCOUNTER — Ambulatory Visit: Payer: Medicare Other | Admitting: Dermatology

## 2023-08-03 DIAGNOSIS — D1801 Hemangioma of skin and subcutaneous tissue: Secondary | ICD-10-CM

## 2023-08-03 DIAGNOSIS — Z1283 Encounter for screening for malignant neoplasm of skin: Secondary | ICD-10-CM | POA: Diagnosis not present

## 2023-08-03 DIAGNOSIS — L905 Scar conditions and fibrosis of skin: Secondary | ICD-10-CM

## 2023-08-03 DIAGNOSIS — L814 Other melanin hyperpigmentation: Secondary | ICD-10-CM

## 2023-08-03 DIAGNOSIS — L853 Xerosis cutis: Secondary | ICD-10-CM

## 2023-08-03 DIAGNOSIS — L578 Other skin changes due to chronic exposure to nonionizing radiation: Secondary | ICD-10-CM | POA: Diagnosis not present

## 2023-08-03 DIAGNOSIS — L820 Inflamed seborrheic keratosis: Secondary | ICD-10-CM

## 2023-08-03 DIAGNOSIS — L729 Follicular cyst of the skin and subcutaneous tissue, unspecified: Secondary | ICD-10-CM

## 2023-08-03 DIAGNOSIS — D2371 Other benign neoplasm of skin of right lower limb, including hip: Secondary | ICD-10-CM

## 2023-08-03 DIAGNOSIS — L821 Other seborrheic keratosis: Secondary | ICD-10-CM

## 2023-08-03 DIAGNOSIS — L570 Actinic keratosis: Secondary | ICD-10-CM

## 2023-08-03 DIAGNOSIS — D229 Melanocytic nevi, unspecified: Secondary | ICD-10-CM

## 2023-08-03 DIAGNOSIS — W908XXA Exposure to other nonionizing radiation, initial encounter: Secondary | ICD-10-CM

## 2023-08-03 DIAGNOSIS — D234 Other benign neoplasm of skin of scalp and neck: Secondary | ICD-10-CM

## 2023-08-03 DIAGNOSIS — Z808 Family history of malignant neoplasm of other organs or systems: Secondary | ICD-10-CM

## 2023-08-03 DIAGNOSIS — D239 Other benign neoplasm of skin, unspecified: Secondary | ICD-10-CM

## 2023-08-03 DIAGNOSIS — L219 Seborrheic dermatitis, unspecified: Secondary | ICD-10-CM

## 2023-08-03 NOTE — Progress Notes (Signed)
 Follow-Up Visit   Subjective  DEMITRIA Hudson is a 68 y.o. female who presents for the following: Skin Cancer Screening and Full Body Skin Exam. Place at nose present since before Thanksgiving flaky and dry, dry spot at central forehead present x a while. Some irritated spots at neck.  The patient presents for Total-Body Skin Exam (TBSE) for skin cancer screening and mole check. The patient has spots, moles and lesions to be evaluated, some may be new or changing and the patient may have concern these could be cancer.   The following portions of the chart were reviewed this encounter and updated as appropriate: medications, allergies, medical history  Review of Systems:  No other skin or systemic complaints except as noted in HPI or Assessment and Plan.  Objective  Well appearing patient in no apparent distress; mood and affect are within normal limits.  A full examination was performed including scalp, head, eyes, ears, nose, lips, neck, chest, axillae, abdomen, back, buttocks, bilateral upper extremities, bilateral lower extremities, hands, feet, fingers, toes, fingernails, and toenails. All findings within normal limits unless otherwise noted below.   Relevant physical exam findings are noted in the Assessment and Plan.  L lower neck x1, R lower neck x1, R chest x1 (3) Stuck on waxy papules with erythema L forearm x1, R forearm x1, L hand x1, Mid forehead x1, Nasal tip x1 (5) Pink scaly macules  Pink blanching slight scaly macule at nasal tip  Assessment & Plan   SKIN CANCER SCREENING PERFORMED TODAY.  ACTINIC DAMAGE - Chronic condition, secondary to cumulative UV/sun exposure - diffuse scaly erythematous macules with underlying dyspigmentation - Recommend daily broad spectrum sunscreen SPF 30+ to sun-exposed areas, reapply every 2 hours as needed.  - Staying in the shade or wearing long sleeves, sun glasses (UVA+UVB protection) and wide brim hats (4-inch brim around the entire  circumference of the hat) are also recommended for sun protection.  - Call for new or changing lesions.  LENTIGINES, SEBORRHEIC KERATOSES, HEMANGIOMAS - Benign normal skin lesions - Benign-appearing - Call for any changes  MELANOCYTIC NEVI - Tan-brown and/or pink-flesh-colored symmetric macules and papules - Benign appearing on exam today - Observation - Call clinic for new or changing moles - Recommend daily use of broad spectrum spf 30+ sunscreen to sun-exposed areas.   FAMILY HISTORY OF SKIN CANCER What type(s): Melanoma Who affected: Sister  Discussed genetic counseling with patient since history of multiple different cancers in siblings including breast and bladder.   DERMATOFIBROMA vs CYST Exam: 6 mm firm, flat pink tan nodule at L lower neck  Treatment Plan: A dermatofibroma is a benign growth possibly related to trauma, such as an insect bite, cut from shaving, or inflamed acne-type bump.  Treatment options to remove include shave or excision with resulting scar and risk of recurrence.  Since benign-appearing and not bothersome, will observe for now.   DERMATOFIBROMA Exam: Firm pink/brown papulenodule with dimple sign at R calf Treatment Plan: A dermatofibroma is a benign growth possibly related to trauma, such as an insect bite, cut from shaving, or inflamed acne-type bump.  Treatment options to remove include shave or excision with resulting scar and risk of recurrence.  Since benign-appearing and not bothersome, will observe for now.   Xerosis - diffuse xerotic patches at legs - recommend gentle, hydrating skin care - gentle skin care handout given  - Recommend OTC Amlactin cream BID, sample given to patient  SCAR Exam: Dyspigmented smooth macule or patch  at R inner thigh  Benign-appearing.  Observation.  Call clinic for new or changing lesions. Recommend daily broad spectrum sunscreen SPF 30+, reapply every 2 hours as needed.   EPIDERMAL INCLUSION CYST Exam:  Firm white bump at L ear concha  Benign-appearing. Exam most consistent with an epidermal inclusion cyst. Discussed that a cyst is a benign growth that can grow over time and sometimes get irritated or inflamed. Recommend observation if it is not bothersome. Discussed option of surgical excision to remove it if it is growing, symptomatic, or other changes noted. Please call for new or changing lesions so they can be evaluated.  SEBORRHEIC DERMATITIS Exam: Pink macule with greasy scale at upper L ear antihelix  Chronic and persistent condition with duration or expected duration over one year. Condition is bothersome/symptomatic for patient. Currently flared.   Seborrheic Dermatitis is a chronic persistent rash characterized by pinkness and scaling most commonly of the mid face but also can occur on the scalp (dandruff), ears; mid chest, mid back and groin.  It tends to be exacerbated by stress and cooler weather.  People who have neurologic disease may experience new onset or exacerbation of existing seborrheic dermatitis.  The condition is not curable but treatable and can be controlled.  Treatment Plan: May use OTC HC 1% cream may apply to aa bid, L ear helix until resolved.  Recommend applying Vaseline to aa.   INFLAMED SEBORRHEIC KERATOSIS (3) L lower neck x1, R lower neck x1, R chest x1 (3) Symptomatic, irritating, patient would like treated. Destruction of lesion - L lower neck x1, R lower neck x1, R chest x1 (3)  Destruction method: cryotherapy   Informed consent: discussed and consent obtained   Lesion destroyed using liquid nitrogen: Yes   Region frozen until ice ball extended beyond lesion: Yes   Outcome: patient tolerated procedure well with no complications   Post-procedure details: wound care instructions given   Additional details:  Prior to procedure, discussed risks of blister formation, small wound, skin dyspigmentation, or rare scar following cryotherapy. Recommend  Vaseline ointment to treated areas while healing.  AK (ACTINIC KERATOSIS) (5) L forearm x1, R forearm x1, L hand x1, Mid forehead x1, Nasal tip x1 (5) Vs. ISK's  AK vs Telangectasia at nasal tip   Actinic keratoses are precancerous spots that appear secondary to cumulative UV radiation exposure/sun exposure over time. They are chronic with expected duration over 1 year. A portion of actinic keratoses will progress to squamous cell carcinoma of the skin. It is not possible to reliably predict which spots will progress to skin cancer and so treatment is recommended to prevent development of skin cancer.  Recommend daily broad spectrum sunscreen SPF 30+ to sun-exposed areas, reapply every 2 hours as needed.  Recommend staying in the shade or wearing long sleeves, sun glasses (UVA+UVB protection) and wide brim hats (4-inch brim around the entire circumference of the hat). Call for new or changing lesions. Destruction of lesion - L forearm x1, R forearm x1, L hand x1, Mid forehead x1, Nasal tip x1 (5)  Destruction method: cryotherapy   Informed consent: discussed and consent obtained   Lesion destroyed using liquid nitrogen: Yes   Region frozen until ice ball extended beyond lesion: Yes   Outcome: patient tolerated procedure well with no complications   Post-procedure details: wound care instructions given   Additional details:  Prior to procedure, discussed risks of blister formation, small wound, skin dyspigmentation, or rare scar following cryotherapy. Recommend Vaseline  ointment to treated areas while healing.   Return in about 1 year (around 08/02/2024) for TBSE.  I, Jacquelynn V. Wilfred, CMA, am acting as scribe for Rexene Rattler, MD .   Documentation: I have reviewed the above documentation for accuracy and completeness, and I agree with the above.  Rexene Rattler, MD

## 2023-08-03 NOTE — Patient Instructions (Addendum)
 For dry skin: recommend using OTC Amlactin twice a day to help soothe skin, use as moisturizer.   For nasal tip: May use OTC Hydrocortisone 1% cream may apply to L ear helix until resolved.  Recommend applying Vaseline.   Gentle Skin Care Guide  1. Bathe no more than once a day.  2. Avoid bathing in hot water  3. Use a mild soap like Dove, Vanicream, Cetaphil, CeraVe. Can use Lever 2000 or Cetaphil antibacterial soap  4. Use soap only where you need it. On most days, use it under your arms, between your legs, and on your feet. Let the water rinse other areas unless visibly dirty.  5. When you get out of the bath/shower, use a towel to gently blot your skin dry, don't rub it.  6. While your skin is still a little damp, apply a moisturizing cream such as Vanicream, CeraVe, Cetaphil, Eucerin, Sarna lotion or plain Vaseline Jelly. For hands apply Neutrogena [place] Hand Cream or Excipial Hand Cream.  7. Reapply moisturizer any time you start to itch or feel dry.  8. Sometimes using free and clear laundry detergents can be helpful. Fabric softener sheets should be avoided. Downy Free & Gentle liquid, or any liquid fabric softener that is free of dyes and perfumes, it acceptable to use  9. If your doctor has given you prescription creams you may apply moisturizers over them      Recommend daily broad spectrum sunscreen SPF 30+ to sun-exposed areas, reapply every 2 hours as needed. Call for new or changing lesions.  Staying in the shade or wearing long sleeves, sun glasses (UVA+UVB protection) and wide brim hats (4-inch brim around the entire circumference of the hat) are also recommended for sun protection.    Melanoma ABCDEs  Melanoma is the most dangerous type of skin cancer, and is the leading cause of death from skin disease.  You are more likely to develop melanoma if you: Have light-colored skin, light-colored eyes, or red or blond hair Spend a lot of time in the sun Tan  regularly, either outdoors or in a tanning bed Have had blistering sunburns, especially during childhood Have a close family member who has had a melanoma Have atypical moles or large birthmarks  Early detection of melanoma is key since treatment is typically straightforward and cure rates are extremely high if we catch it early.   The first sign of melanoma is often a change in a mole or a new dark spot.  The ABCDE system is a way of remembering the signs of melanoma.  A for asymmetry:  The two halves do not match. B for border:  The edges of the growth are irregular. C for color:  A mixture of colors are present instead of an even brown color. D for diameter:  Melanomas are usually (but not always) greater than 6mm - the size of a pencil eraser. E for evolution:  The spot keeps changing in size, shape, and color.  Please check your skin once per month between visits. You can use a small mirror in front and a large mirror behind you to keep an eye on the back side or your body.   If you see any new or changing lesions before your next follow-up, please call to schedule a visit.  Please continue daily skin protection including broad spectrum sunscreen SPF 30+ to sun-exposed areas, reapplying every 2 hours as needed when you're outdoors.   Staying in the shade or wearing long sleeves,  sun glasses (UVA+UVB protection) and wide brim hats (4-inch brim around the entire circumference of the hat) are also recommended for sun protection.    Due to recent changes in healthcare laws, you may see results of your pathology and/or laboratory studies on MyChart before the doctors have had a chance to review them. We understand that in some cases there may be results that are confusing or concerning to you. Please understand that not all results are received at the same time and often the doctors may need to interpret multiple results in order to provide you with the best plan of care or course of  treatment. Therefore, we ask that you please give us  2 business days to thoroughly review all your results before contacting the office for clarification. Should we see a critical lab result, you will be contacted sooner.   If You Need Anything After Your Visit  If you have any questions or concerns for your doctor, please call our main line at [phone] and press option 4 to reach your doctor's medical assistant. If no one answers, please leave a voicemail as directed and we will return your call as soon as possible. Messages left after 4 pm will be answered the following business day.   You may also send us  a message via MyChart. We typically respond to MyChart messages within 1-2 business days.  For prescription refills, please ask your pharmacy to contact our office. Our fax number is [phone].  If you have an urgent issue when the clinic is closed that cannot wait until the next business day, you can page your doctor at the number below.    Please note that while we do our best to be available for urgent issues outside of office hours, we are not available 24/7.   If you have an urgent issue and are unable to reach us , you may choose to seek medical care at your doctor's office, retail clinic, urgent care center, or emergency room.  If you have a medical emergency, please immediately call 911 or go to the emergency department.  Pager Numbers  - Dr. Hester: [phone]  - Dr. Jackquline: [phone]  - Dr. Claudene: [phone]   In the event of inclement weather, please call our main line at [phone] for an update on the status of any delays or closures.  Dermatology Medication Tips: Please keep the boxes that topical medications come in in order to help keep track of the instructions about where and how to use these. Pharmacies typically print the medication instructions only on the boxes and not directly on the medication tubes.   If your medication is too expensive,  please contact our office at [phone] option 4 or send us  a message through MyChart.   We are unable to tell what your co-pay for medications will be in advance as this is different depending on your insurance coverage. However, we may be able to find a substitute medication at lower cost or fill out paperwork to get insurance to cover a needed medication.   If a prior authorization is required to get your medication covered by your insurance company, please allow us  1-2 business days to complete this process.  Drug prices often vary depending on where the prescription is filled and some pharmacies may offer cheaper prices.  The website www.goodrx.com contains coupons for medications through different pharmacies. The prices here do not account for what the cost may be with help from insurance (it may be cheaper with your  insurance), but the website can give you the price if you did not use any insurance.  - You can print the associated coupon and take it with your prescription to the pharmacy.  - You may also stop by our office during regular business hours and pick up a GoodRx coupon card.  - If you need your prescription sent electronically to a different pharmacy, notify our office through [facility] or by phone at [phone] option 4.     Si Usted Necesita Algo Despus de Su Visita  Tambin puede enviarnos un mensaje a travs de Clinical cytogeneticist. Por lo general respondemos a los mensajes de MyChart en el transcurso de 1 a 2 das hbiles.  Para renovar recetas, por favor pida a su farmacia que se ponga en contacto con nuestra oficina. Randi lakes de fax es [place] [phone].  Si tiene un asunto urgente cuando la clnica est cerrada y que no puede esperar hasta el siguiente da hbil, puede llamar/localizar a su doctor(a) al nmero que aparece a continuacin.   Por favor, tenga en cuenta que aunque hacemos todo lo posible para estar disponibles para asuntos urgentes fuera del  horario de [place], no estamos disponibles las 24 horas del da, los 7 [address]  [address] de la [place].   Si tiene un problema urgente y no puede comunicarse con nosotros, puede optar por buscar atencin mdica  en el consultorio de su doctor(a), en una clnica privada, en un centro de atencin urgente o en una sala de emergencias.  Si tiene Engineer, drilling, por favor llame inmediatamente al 911 o vaya a la sala de emergencias.  Nmeros de bper  - Dr. Hester: [phone]  - Dra. Jackquline: [phone]  - Dr. Claudene: [phone]   En caso de inclemencias del tiempo, por favor llame a landry capes principal al [phone] para una actualizacin sobre el [place] de cualquier retraso o cierre.  Consejos para la medicacin en dermatologa: Por favor, guarde las cajas en las que vienen los medicamentos de uso tpico para ayudarle a seguir las instrucciones sobre dnde y cmo usarlos. Las farmacias generalmente imprimen las instrucciones del medicamento slo en las cajas y no directamente en los tubos del [place].   Si su medicamento es muy caro, por favor, pngase en contacto con landry rieger llamando al [phone] y presione la opcin 4 o envenos un mensaje a travs de Clinical cytogeneticist.   No podemos decirle cul ser su copago por los medicamentos por adelantado ya que esto es diferente dependiendo de la cobertura de su seguro. Sin embargo, es posible que podamos encontrar un medicamento sustituto a Audiological scientist un formulario para que el seguro cubra el medicamento que se considera necesario.   Si se requiere una autorizacin previa para que su compaa de seguros malta su medicamento, por favor permtanos de 1 a 2 das hbiles para completar este proceso.  Los precios de los medicamentos varan con frecuencia dependiendo del Environmental consultant de dnde se surte la receta y alguna farmacias pueden ofrecer precios ms baratos.  El sitio web www.goodrx.com tiene cupones para medicamentos de Engineer, civil (consulting). Los precios aqu no tienen en cuenta lo que podra costar con la ayuda del seguro (puede ser ms barato con su seguro), pero el sitio web puede darle el precio si no utiliz Tourist information centre manager.  - Puede imprimir el cupn correspondiente y llevarlo con su receta a la farmacia.  - Tambin puede pasar por nuestra oficina durante el horario de atencin regular y Librarian, academic tarjeta  de cupones de GoodRx.  - Si necesita que su receta se enve electrnicamente a una farmacia diferente, informe a nuestra oficina a travs de MyChart de [place] o por telfono llamando al [phone] y presione la opcin 4.

## 2023-10-20 ENCOUNTER — Ambulatory Visit
Admission: RE | Admit: 2023-10-20 | Discharge: 2023-10-20 | Disposition: A | Discharge status: Home / Self Care | Payer: Self-pay | Source: Ambulatory Visit | Attending: Family Medicine | Admitting: Family Medicine

## 2023-10-20 ENCOUNTER — Other Ambulatory Visit: Payer: Self-pay | Admitting: Family Medicine

## 2023-10-20 DIAGNOSIS — R7303 Prediabetes: Secondary | ICD-10-CM

## 2023-10-20 DIAGNOSIS — Z1231 Encounter for screening mammogram for malignant neoplasm of breast: Secondary | ICD-10-CM

## 2023-10-20 MED ORDER — IOHEXOL 300 MG/ML  SOLN: 75.0000 mL | Freq: Once | INTRAMUSCULAR | Status: DC | PRN

## 2023-11-19 ENCOUNTER — Ambulatory Visit
Admission: RE | Admit: 2023-11-19 | Discharge: 2023-11-19 | Disposition: A | Discharge status: Home / Self Care | Source: Ambulatory Visit | Attending: Family Medicine | Admitting: Family Medicine

## 2023-11-19 DIAGNOSIS — Z1231 Encounter for screening mammogram for malignant neoplasm of breast: Secondary | ICD-10-CM | POA: Insufficient documentation

## 2024-08-01 ENCOUNTER — Encounter: Admitting: Dermatology
